# Patient Record
Sex: Female | Born: 2005
Health system: Southern US, Community
[De-identification: ages and names within clinical notes are randomized; demographics above are authoritative.]

## PROBLEM LIST (undated history)

## (undated) DIAGNOSIS — L309 Dermatitis, unspecified: Secondary | ICD-10-CM

## (undated) DIAGNOSIS — R0683 Snoring: Secondary | ICD-10-CM

## (undated) DIAGNOSIS — J309 Allergic rhinitis, unspecified: Secondary | ICD-10-CM

## (undated) DIAGNOSIS — E669 Obesity, unspecified: Secondary | ICD-10-CM

## (undated) DIAGNOSIS — J353 Hypertrophy of tonsils with hypertrophy of adenoids: Secondary | ICD-10-CM

## (undated) DIAGNOSIS — E119 Type 2 diabetes mellitus without complications: Secondary | ICD-10-CM

## (undated) DIAGNOSIS — H539 Unspecified visual disturbance: Secondary | ICD-10-CM

## (undated) HISTORY — PX: ROOT CANAL: SHX2363

## (undated) HISTORY — PX: LEG SURGERY: SHX1003

## (undated) HISTORY — DX: Allergic rhinitis, unspecified: J30.9

## (undated) HISTORY — DX: Type 2 diabetes mellitus without complications: E11.9

## (undated) HISTORY — DX: Obesity, unspecified: E66.9

## (undated) HISTORY — DX: Dermatitis, unspecified: L30.9

---
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
## 2023-01-09 ENCOUNTER — Ambulatory Visit (INDEPENDENT_AMBULATORY_CARE_PROVIDER_SITE_OTHER): Payer: Commercial Managed Care - PPO | Admitting: Pediatric Endocrinology

## 2023-01-14 DIAGNOSIS — F3289 Other specified depressive episodes: Secondary | ICD-10-CM | POA: Diagnosis not present

## 2023-01-20 ENCOUNTER — Other Ambulatory Visit (HOSPITAL_COMMUNITY): Payer: Self-pay

## 2023-01-20 ENCOUNTER — Other Ambulatory Visit: Payer: Self-pay

## 2023-01-28 DIAGNOSIS — F3289 Other specified depressive episodes: Secondary | ICD-10-CM | POA: Diagnosis not present

## 2023-01-31 ENCOUNTER — Encounter (INDEPENDENT_AMBULATORY_CARE_PROVIDER_SITE_OTHER): Payer: Self-pay

## 2023-02-04 ENCOUNTER — Ambulatory Visit (INDEPENDENT_AMBULATORY_CARE_PROVIDER_SITE_OTHER): Payer: Commercial Managed Care - PPO | Admitting: Pediatric Endocrinology

## 2023-02-10 ENCOUNTER — Other Ambulatory Visit (HOSPITAL_COMMUNITY): Payer: Self-pay

## 2023-02-10 ENCOUNTER — Other Ambulatory Visit (INDEPENDENT_AMBULATORY_CARE_PROVIDER_SITE_OTHER): Payer: Self-pay | Admitting: Pediatric Endocrinology

## 2023-02-10 DIAGNOSIS — E109 Type 1 diabetes mellitus without complications: Secondary | ICD-10-CM

## 2023-02-10 MED ORDER — DEXCOM G6 TRANSMITTER MISC
3 refills | Status: AC
Start: 2023-02-10 — End: 2024-02-10
  Filled 2023-02-10: qty 1, 90d supply, fill #0
  Filled 2023-03-02: qty 1, 90d supply, fill #1
  Filled 2023-03-04: qty 1, 30d supply, fill #1
  Filled 2023-03-04: qty 1, 90d supply, fill #1
  Filled 2023-06-02: qty 1, 90d supply, fill #2

## 2023-02-11 DIAGNOSIS — F3289 Other specified depressive episodes: Secondary | ICD-10-CM | POA: Diagnosis not present

## 2023-02-12 ENCOUNTER — Other Ambulatory Visit (HOSPITAL_COMMUNITY): Payer: Self-pay

## 2023-02-13 ENCOUNTER — Encounter (INDEPENDENT_AMBULATORY_CARE_PROVIDER_SITE_OTHER): Payer: Self-pay

## 2023-02-17 ENCOUNTER — Other Ambulatory Visit (HOSPITAL_COMMUNITY): Payer: Self-pay

## 2023-02-17 ENCOUNTER — Other Ambulatory Visit (INDEPENDENT_AMBULATORY_CARE_PROVIDER_SITE_OTHER): Payer: Self-pay | Admitting: Pediatric Endocrinology

## 2023-02-18 ENCOUNTER — Other Ambulatory Visit: Payer: Self-pay

## 2023-02-18 ENCOUNTER — Other Ambulatory Visit (HOSPITAL_COMMUNITY): Payer: Self-pay

## 2023-02-18 MED FILL — "Insulin Pen Needle 32 G X 4 MM (1/6"" or 5/32"")": 90 days supply | Qty: 100 | Fill #0 | Status: AC

## 2023-02-19 ENCOUNTER — Other Ambulatory Visit: Payer: Self-pay

## 2023-02-26 ENCOUNTER — Other Ambulatory Visit (HOSPITAL_COMMUNITY): Payer: Self-pay

## 2023-03-03 ENCOUNTER — Other Ambulatory Visit (HOSPITAL_COMMUNITY): Payer: Self-pay

## 2023-03-04 ENCOUNTER — Other Ambulatory Visit (HOSPITAL_COMMUNITY): Payer: Self-pay

## 2023-03-08 ENCOUNTER — Other Ambulatory Visit (HOSPITAL_COMMUNITY): Payer: Self-pay

## 2023-03-16 ENCOUNTER — Other Ambulatory Visit (HOSPITAL_COMMUNITY): Payer: Self-pay

## 2023-03-17 ENCOUNTER — Other Ambulatory Visit: Payer: Self-pay

## 2023-03-17 ENCOUNTER — Other Ambulatory Visit (HOSPITAL_COMMUNITY): Payer: Self-pay

## 2023-03-18 ENCOUNTER — Ambulatory Visit (INDEPENDENT_AMBULATORY_CARE_PROVIDER_SITE_OTHER): Payer: Commercial Managed Care - PPO | Admitting: Pediatric Endocrinology

## 2023-03-18 ENCOUNTER — Encounter (INDEPENDENT_AMBULATORY_CARE_PROVIDER_SITE_OTHER): Payer: Self-pay | Admitting: Pediatric Endocrinology

## 2023-03-18 VITALS — BP 116/60 | HR 85 | Ht 60.0 in | Wt 250.0 lb

## 2023-03-18 DIAGNOSIS — E119 Type 2 diabetes mellitus without complications: Secondary | ICD-10-CM

## 2023-03-18 DIAGNOSIS — Z7985 Long-term (current) use of injectable non-insulin antidiabetic drugs: Secondary | ICD-10-CM | POA: Diagnosis not present

## 2023-03-18 DIAGNOSIS — Z794 Long term (current) use of insulin: Secondary | ICD-10-CM | POA: Diagnosis not present

## 2023-03-18 LAB — POCT GLUCOSE (DEVICE FOR HOME USE): POC Glucose: 136 mg/dl — AB (ref 70–99)

## 2023-03-18 LAB — POCT GLYCOSYLATED HEMOGLOBIN (HGB A1C): Hemoglobin A1C: 5.6 % (ref 4.0–5.6)

## 2023-03-18 MED ORDER — OZEMPIC (2 MG/DOSE) 8 MG/3ML ~~LOC~~ SOPN
2.0000 mg | PEN_INJECTOR | SUBCUTANEOUS | 6 refills | Status: AC
Start: 2023-03-18 — End: ?
  Filled 2023-03-18: qty 6, 56d supply, fill #0
  Filled 2023-04-21: qty 9, 84d supply, fill #0
  Filled 2023-10-06 (×2): qty 9, 84d supply, fill #1

## 2023-03-18 NOTE — Progress Notes (Signed)
Subjective:  Subjective  Patient Name: Tina Greene Date of Birth: May 14, 2006  MRN: [id number]  Tina Greene  presents to clinic today for follow up evaluation and management of her new onset diabetes.   HISTORY OF PRESENT ILLNESS:   Tina Greene is a 16 y.o. AA female   Tina Greene is unaccompanied today  1. Tina Greene was seen in her PCP office on 01/14/18 for her 12 year WCC. At that visit she was noted to have interval weight loss which was felt to be intentional. She had labs drawn which revealed a hemoglobin A1C of 12.6%. Her sister has type 1 (antibody negative) diabetes diagnosed at age 55. Tina Greene was referred to endocrinology for further evaluation and management.   2. Tina Greene was last seen in pediatric endocrine clinic on 07/02/22 In the interim she has been generally healthy.    She has continued on 2 mg of Ozempic. She thinks that it is working well for her.   She is taking her injection on Sundays.   No GI symptoms associated with her Ozempic.   She is still taking 33 units of Semglee at night. She says that when she forgets to take the [facility] she can tell a difference in her sugars.   No hypoglycemia.   She is still wearing her Dexcom feels good about her glucose control. Would like to move to G7 but wants to wait until she sees new provider.   She just moved back to campus this last week.She is readjusting to walking hills etc. 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    Period:  Periods are "getting back towards regular".    3. Pertinent Review of Systems:  Constitutional: The patient feels "okay/good". The patient seems healthy and active. Eyes: Vision seems to be good. There are no recognized eye problems. Wears glasses. Went to ophthalmology in January 2024.  Neck: The patient has no complaints of anterior neck swelling, soreness, tenderness, pressure, discomfort, or difficulty swallowing.   Heart: Heart rate increases with exercise or other  physical activity. The patient has no complaints of palpitations, irregular heart beats, chest pain, or chest pressure.   Lungs: no asthma or wheezing.  Gastrointestinal: Bowel movents seem normal. The patient has no complaints of excessive hunger, acid reflux, upset stomach, stomach aches or pains, diarrhea, or constipation.  Legs: Muscle mass and strength seem normal. There are no complaints of numbness, tingling, burning, or pain. No edema is noted. Plate in leg from bowed leg.  Feet: There are no obvious foot problems. There are no complaints of numbness, tingling, burning, or pain. No edema is noted. Neurologic: There are no recognized problems with muscle movement and strength, sensation, or coord GYN/GU: periods irregular. Menarche at age 74.  LMP - getting monthly cycles since last visit - LMP 03/06/23 Annual labs: January 2023- done- no issues.   Diabetes ID- still need one   Eye exam- January 2023   Dexcom CGM download:                   PAST MEDICAL, FAMILY, AND SOCIAL HISTORY  Past Medical History:  Diagnosis Date   Adenotonsillar hypertrophy    Allergic rhinitis 03/30/2013   Diabetes mellitus without complication (HCC)    Phreesia 01/17/2020   Eczema 03/30/2013   Obesity, unspecified 03/30/2013   Snores    Vision abnormalities    wears glasses    Family History  Problem Relation Age of Onset   Diabetes Maternal  Grandfather    Cancer Paternal Grandmother        breast   Diabetes Sister    Hypertension Maternal Grandmother    Hypertension Father    Diabetes Mother    Diabetes Paternal Grandfather    Parkinsonism Paternal Grandfather      Current Outpatient Medications:    cetirizine (ZYRTEC) 10 MG tablet, Take 10 mg by mouth daily., Disp: , Rfl:    Continuous Blood Gluc Receiver (DEXCOM G6 RECEIVER) DEVI, 1 Device by Does not apply route as directed., Disp: 1 each, Rfl: 2   Continuous Glucose Sensor (DEXCOM G6 SENSOR) MISC, Change sensor every 10 days,  Disp: 3 each, Rfl: 5   Continuous Glucose Transmitter (DEXCOM G6 TRANSMITTER) MISC, USE AS DIRECTED. (RE-USE UP TO 8X WITH EACH NEW SENSOR), Disp: 1 each, Rfl: 3   ibuprofen (ADVIL) 200 MG tablet, Take 200 mg by mouth every 6 (six) hours as needed., Disp: , Rfl:    insulin glargine-yfgn (SEMGLEE) 100 UNIT/ML Pen, Inject 50 Units into the skin daily., Disp: 15 mL, Rfl: 5   Insulin Pen Needle (TECHLITE PLUS PEN NEEDLES) 32G X 4 MM MISC, Use as directed with semglee, Disp: 100 each, Rfl: 1   tretinoin (RETIN-A) 0.025 % cream, Apply 1 Application topically to face at night., Disp: 45 g, Rfl: PRN   Clindamycin-Benzoyl Per, Refr, gel, Apply 1 application  topically in the morning. (Patient not taking: Reported on 07/02/2022), Disp: 45 g, Rfl: PRN   Glucagon (BAQSIMI TWO PACK) 3 MG/DOSE POWD, Place 1 spray into the nose as directed. (Patient not taking: Reported on 03/18/2023), Disp: 2 each, Rfl: 3   Insulin Glargine (BASAGLAR KWIKPEN) 100 UNIT/ML, INJECT UP TO 50 UNITS DAILY, Disp: 15 mL, Rfl: 6   insulin lispro (HUMALOG) 200 UNIT/ML KwikPen, USE UP TO 100 UNITS PER DAY AS DIRECTED BY DR. DIAL PEN THE SAME AS PREVIOUSLY- INSULIN IS CONCENTRATED- WILL GET SMALLER VOLUME FOR SAME NUMBER UNITS (Patient not taking: Reported on 07/02/2022), Disp: 15 mL, Rfl: 5   Semaglutide, 2 MG/DOSE, (OZEMPIC, 2 MG/DOSE,) 8 MG/3ML SOPN, Inject 2 mg into the skin once a week., Disp: 6 mL, Rfl: 6   spironolactone (ALDACTONE) 50 MG tablet, Take 1 tablet (50 mg total) by mouth daily. (Patient not taking: Reported on 03/18/2023), Disp: 30 tablet, Rfl: 3  Allergies as of 03/18/2023   (No Known Allergies)     reports that she has never smoked. She has never used smokeless tobacco. She reports that she does not drink alcohol. Pediatric History  Patient Parents   Bang,Tina (Mother)   Favila,Tina (Father)   Other Topics Concern   Not on file  Social History Narrative   Lives with both parents, She enjoys dance. Dual  Enrolled at [facility] [facility] [employer] [facility] [employer] and Early college [employer].   She would l to [facility] for Occupational Therapy.    No smokers      Lives with mom and dad and a hound named Jax     1. School and Family: college at [employer].  Sophomore.  2. Activities:  Designer, television/film set for weddings. [employer] member  3. Primary Care Provider: Pcp, No  ROS: There are no other significant problems involving Tina Greene's other body systems.    Objective:  Objective  Vital Signs:    BP 116/60   Pulse 85   Ht 5' (1.524 m)   Wt 250 lb (113.4 kg)   LMP 03/10/2023  BMI 48.82 kg/m   Blood pressure %iles are not available for patients who are 18 years or older.  Ht Readings from Last 3 Encounters:  03/18/23 5' (1.524 m) (5%, Z= -1.68)*  02/18/22 5' 0.32" (1.532 m) (6%, Z= -1.54)*  12/06/21 5' 0.5" (1.537 m) (7%, Z= -1.46)*   * Growth percentiles are based on CDC (Girls, 2-20 Years) data.   Wt Readings from Last 3 Encounters:  03/18/23 250 lb (113.4 kg) (>99%, Z= 2.50)*  10/09/22 251 lb 12.8 oz (114.2 kg) (>99%, Z= 2.49)*  07/02/22 249 lb 3.2 oz (113 kg) (>99%, Z= 2.46)*   * Growth percentiles are based on CDC (Girls, 2-20 Years) data.   HC Readings from Last 3 Encounters:  No data found for [facility]   Body surface area is 2.19 meters squared. 5 %ile (Z= -1.68) based on CDC (Girls, 2-20 Years) Stature-for-age data based on Stature recorded on 03/18/2023. >99 %ile (Z= 2.50) based on CDC (Girls, 2-20 Years) weight-for-age data using data from 03/18/2023.   PHYSICAL EXAM:    Physical Exam Vitals reviewed.  Constitutional:      Appearance: Normal appearance.     Comments: Weight is stable  HENT:     Head: Normocephalic.     Right Ear: External ear normal.     Left Ear: External ear normal.     Nose: Nose normal.     Mouth/Throat:     Mouth: Mucous membranes are moist.  Eyes:     Extraocular Movements: Extraocular movements intact.   Cardiovascular:     Rate and Rhythm: Normal rate and regular rhythm.     Pulses: Normal pulses.     Heart sounds: Normal heart sounds.  Pulmonary:     Effort: Pulmonary effort is normal.     Breath sounds: Normal breath sounds.  Abdominal:     Palpations: Abdomen is soft.  Musculoskeletal:        General: Normal range of motion.     Cervical back: Normal range of motion.  Skin:    General: Skin is warm and dry.     Capillary Refill: Capillary refill takes less than 2 seconds.     Comments: +1 acanthosis  Neurological:     General: No focal deficit present.     Mental Status: She is alert.  Psychiatric:        Mood and Affect: Mood normal.    LAB DATA:     Lab Results  Component Value Date   HGBA1C 5.6 03/18/2023   HGBA1C 5.8 10/09/2022   HGBA1C 6.0 07/02/2022   HGBA1C 6.2 02/18/2022   HGBA1C 6.3 (H) 11/12/2021   HGBA1C 6.5 07/31/2021   HGBA1C 6.8 (A) 04/26/2021   HGBA1C 6.5 (A) 01/22/2021     Results for orders placed or performed in visit on 03/18/23  POCT glycosylated hemoglobin (Hb A1C)  Result Value Ref Range   Hemoglobin A1C 5.6 4.0 - 5.6 %   HbA1c POC (<> result, manual entry)     HbA1c, POC (prediabetic range)     HbA1c, POC (controlled diabetic range)    POCT Glucose (Device for Home Use)  Result Value Ref Range   Glucose Fasting, POC     POC Glucose 136 (A) 70 - 99 mg/dl      Assessment and Plan:  Assessment  ASSESSMENT: Angel is a 17 y.o. AA female with antibody negative, insulin dependant diabetes (like her sister).   Type 2 diabetes on insulin.  - Hemoglobin A1C POC reviewed  as above  She is in target for A1C control and has continued to improve.  - POC CGB reviewed as above  - Semglee 33 units nightly - Ozempic 2 mg weekly - Dexcom still with 91% in target. Goal is >70% in target. Dexcom report reviewed in detail with patient.  - Referral placed to adult endocrine in [place].   PLAN:   1. Diagnostic:  Orders Placed This Encounter   Procedures   Ambulatory referral to Endocrinology    Referral Priority:   Routine    Referral Type:   Consultation    Referral Reason:   Specialty Services Required    Referred to Provider:   Roma Kayser, MD    Number of Visits Requested:   1   POCT glycosylated hemoglobin (Hb A1C)   POCT Glucose (Device for Home Use)   COLLECTION CAPILLARY BLOOD SPECIMEN    2. Therapeutic:  Semglee 33 units.   Ozempic as above.  3. Patient education: lengthy discussion of the above.  She is overall doing well.  4. Follow-up: Return for Adult endocrine .      Dessa Phi, MD  >30 minutes spent today reviewing the medical chart, counseling the patient/family, and documenting today's encounter.   Patient referred by No ref. provider found for  Diabetes   Copy of this note sent to Pcp, No

## 2023-03-19 ENCOUNTER — Other Ambulatory Visit (HOSPITAL_COMMUNITY): Payer: Self-pay

## 2023-03-19 DIAGNOSIS — F3289 Other specified depressive episodes: Secondary | ICD-10-CM | POA: Diagnosis not present

## 2023-03-20 ENCOUNTER — Other Ambulatory Visit (HOSPITAL_COMMUNITY): Payer: Self-pay

## 2023-03-22 ENCOUNTER — Other Ambulatory Visit (HOSPITAL_COMMUNITY): Payer: Self-pay

## 2023-03-22 MED ORDER — CLINDAMYCIN PHOS-BENZOYL PEROX 1.2-5 % EX GEL
1.0000 | Freq: Every morning | CUTANEOUS | 5 refills | Status: AC
  Filled 2023-03-22: qty 45, 30d supply, fill #0

## 2023-03-24 ENCOUNTER — Other Ambulatory Visit: Payer: Self-pay

## 2023-04-02 DIAGNOSIS — F3289 Other specified depressive episodes: Secondary | ICD-10-CM | POA: Diagnosis not present

## 2023-04-03 ENCOUNTER — Other Ambulatory Visit (HOSPITAL_COMMUNITY): Payer: Self-pay

## 2023-04-03 MED ORDER — AMOXICILLIN 250 MG PO CAPS
250.0000 mg | ORAL_CAPSULE | Freq: Three times a day (TID) | ORAL | 0 refills | Status: DC
  Filled 2023-04-03: qty 15, 5d supply, fill #0

## 2023-04-04 ENCOUNTER — Other Ambulatory Visit (HOSPITAL_COMMUNITY): Payer: Self-pay

## 2023-04-08 DIAGNOSIS — F3289 Other specified depressive episodes: Secondary | ICD-10-CM | POA: Diagnosis not present

## 2023-04-11 DIAGNOSIS — L218 Other seborrheic dermatitis: Secondary | ICD-10-CM | POA: Diagnosis not present

## 2023-04-19 ENCOUNTER — Other Ambulatory Visit (HOSPITAL_COMMUNITY): Payer: Self-pay

## 2023-04-21 ENCOUNTER — Other Ambulatory Visit: Payer: Self-pay

## 2023-04-22 ENCOUNTER — Ambulatory Visit (INDEPENDENT_AMBULATORY_CARE_PROVIDER_SITE_OTHER): Payer: Commercial Managed Care - PPO | Admitting: Women's Health

## 2023-04-22 ENCOUNTER — Other Ambulatory Visit (HOSPITAL_COMMUNITY)
Admission: RE | Admit: 2023-04-22 | Discharge: 2023-04-22 | Disposition: A | Discharge status: Home / Self Care | Payer: Commercial Managed Care - PPO | Source: Ambulatory Visit | Attending: Women's Health | Admitting: Women's Health

## 2023-04-22 ENCOUNTER — Encounter: Payer: Self-pay | Admitting: Women's Health

## 2023-04-22 VITALS — BP 109/69 | HR 70 | Ht 60.0 in | Wt 252.8 lb

## 2023-04-22 DIAGNOSIS — N921 Excessive and frequent menstruation with irregular cycle: Secondary | ICD-10-CM

## 2023-04-22 DIAGNOSIS — E282 Polycystic ovarian syndrome: Secondary | ICD-10-CM | POA: Diagnosis not present

## 2023-04-22 DIAGNOSIS — N926 Irregular menstruation, unspecified: Secondary | ICD-10-CM

## 2023-04-22 DIAGNOSIS — Z113 Encounter for screening for infections with a predominantly sexual mode of transmission: Secondary | ICD-10-CM | POA: Insufficient documentation

## 2023-04-22 NOTE — Progress Notes (Signed)
GYN VISIT Patient name: AILANIE HOWMAN MRN [id number]  Date of birth: 05-31-2006 Chief Complaint:   Menstrual Problem (Irregular menses)  History of Present Illness:   DANAEJA SCHIRM is a 17 y.o. G0P0000 African-American female being seen today for irregular periods.    Patient's last menstrual period was 04/15/2023 (exact date). Menarche 9-10yo, regular until about 16-15yo. Most of time 1 period monthly, sometimes goes 46-50d in between periods. In July period lasted x 16d, sometimes heavy and changes saturated super tampon q2hr, bad cramps and dime sized clots. Does have to shave sideburns, on meds for acne. No hair thinning. Does have dark spots on neck and axilla. Doesn't really want to be on birth control. Concerned about weight gain and side effects. Never sexually active. Denies abnormal discharge, itching/odor/irritation.    The current method of family planning is abstinence.  Last pap <21yo. Results were: N/A     04/22/2023    1:56 PM 12/06/2021    1:46 PM 01/19/2020    2:29 PM 01/18/2019   11:15 AM  Depression screen PHQ 2/9  Decreased Interest 0 1 0 0  Down, Depressed, Hopeless 0 0 0 0  PHQ - 2 Score 0 1 0 0  Altered sleeping 1 0 0 3  Tired, decreased energy 1 1 0 2  Change in appetite 0 0 0 3  Feeling bad or failure about yourself  0 0 0 0  Trouble concentrating 0 1 0 0  Moving slowly or fidgety/restless 0 0 0 0  Suicidal thoughts 0  0   PHQ-9 Score 2 3 0 8        04/22/2023    1:56 PM  GAD 7 : Generalized Anxiety Score  Nervous, Anxious, on Edge 1  Control/stop worrying 1  Worry too much - different things 1  Trouble relaxing 0  Restless 0  Easily annoyed or irritable 1  Afraid - awful might happen 0  Total GAD 7 Score 4     Review of Systems:   Pertinent items are noted in HPI Denies fever/chills, dizziness, headaches, visual disturbances, fatigue, shortness of breath, chest pain, abdominal pain, vomiting, abnormal vaginal  discharge/itching/odor/irritation, problems with periods, bowel movements, urination, or intercourse unless otherwise stated above.  Pertinent History Reviewed:  Reviewed past medical,surgical, social, obstetrical and family history.  Reviewed problem list, medications and allergies. Physical Assessment:   Vitals:   04/22/23 1350  BP: 109/69  Pulse: 70  Weight: 252 lb 12.8 oz (114.7 kg)  Height: 5' (1.524 m)  Body mass index is 49.37 kg/m.       Physical Examination:   General appearance: alert, well appearing, and in no distress  Mental status: alert, oriented to person, place, and time  Skin: warm & dry, acanthosis nigricans neck and axilla  Cardiovascular: normal heart rate noted  Respiratory: normal respiratory effort, no distress  Abdomen: soft, non-tender   Pelvic: VULVA: normal appearing vulva with no masses, tenderness or lesions, VAGINA: normal appearing vagina with normal color and discharge, no lesions, CERVIX: normal appearing cervix without discharge or lesions, UTERUS: limited by habitus, no abnormalities noted, ADNEXA: limited by habitus, no abnormalities noted  Extremities: no edema   Chaperone: Sherri Kaywood    No results found for this or any previous visit (from the past 24 hour(s)).  Assessment & Plan:  1) Menorrhagia w/ irregular cycles likely r/t PCOS> CV swab sent, discussed options, gave brochure on options, wants to take home and think about it. Discussed  provera q 2-87mth if no period if she decides against birth control options. Gave printed info on PCOS diet and role of weight loss w/ regulating periods w/ PCOS  Meds: No orders of the defined types were placed in this encounter.   No orders of the defined types were placed in this encounter.   Return for will let us know if she needs anything.  Cheral Marker CNM, [facility] 04/22/2023 2:26 PM

## 2023-04-22 NOTE — Patient Instructions (Signed)
Diet for Polycystic Ovary Syndrome  Polycystic ovary syndrome (PCOS) is a common hormonal disorder that affects a woman's reproductive system. It can cause problems with menstrual periods and make it hard to get and stay pregnant. Changing what you eat can help your hormones reach normal levels, improve your health, and help you better manage PCOS.  Following a balanced diet can help you lose weight and improve the way that your body uses the hormone insulin to control blood sugar. This may include:  Eating low-fat (lean) proteins, complex carbohydrates, fresh fruits and vegetables, low-fat dairy products, healthy fats, and fiber.  Cutting down on calories.  Exercising regularly.  What are tips for following this plan?  Follow a balanced diet for meals and snacks. Eat breakfast, lunch, dinner, and one or two snacks every day.  Include protein in each meal and snack.  Choose whole grains instead of products that are made with refined flour.  Eat a variety of foods.  Exercise regularly as told by your health care provider. Aim to do at least 30 minutes of exercise on most days of the week.  If you are overweight or obese:  Pay attention to how many calories you eat. Cutting down on calories can help you lose weight.  Work with your health care provider or a dietitian to figure out how many calories you need each day.  What foods should I eat?    Fruits  Include a variety of colors and types. All fruits are helpful for PCOS.  Vegetables  Include a variety of colors and types. All vegetables are helpful for PCOS.  Grains  Whole grains, such as whole wheat. Whole-grain breads, crackers, cereals, and pasta. Unsweetened oatmeal. Bulgur, barley, quinoa, and brown rice. Tortillas made from corn or whole-wheat flour.  Meats and other proteins  Lean proteins, such as fish, chicken, beans, eggs, and tofu.  Dairy  Low-fat dairy products, such as skim milk, cheese sticks, and yogurt.  Beverages  Low-fat or fat-free drinks, such  as water, low-fat milk, sugar-free drinks, and small amounts of 100% fruit juice.  Seasonings and condiments  Ketchup. Mustard. Barbecue sauce. Relish. Low-fat or fat-free mayonnaise.  Fats and oils  Olive oil or canola oil. Walnuts and almonds.  The items listed above may not be a complete list of recommended foods and beverages. Contact a dietitian for more options.  What foods should I avoid?  Foods that are high in calories or fat, especially saturated or trans fats. Fried foods. Sweets. Products that are made from refined white flour, including white bread, pastries, white rice, and pasta.  The items listed above may not be a complete list of foods and beverages to avoid. Contact a dietitian for more information.  Summary  PCOS is a hormonal imbalance that affects a woman's reproductive system. It can cause problems with menstrual periods and make it hard to get and stay pregnant.  You can help to manage your PCOS by exercising regularly and eating a healthy, varied diet of vegetables, fruit, whole grains, lean protein, and low-fat dairy products.  Changing what you eat can improve the way that your body uses insulin, help your hormones reach normal levels, and help you lose weight.  This information is not intended to replace advice given to you by your health care provider. Make sure you discuss any questions you have with your health care provider.  Document Revised: 12/23/2019 Document Reviewed: 12/23/2019  Elsevier Patient Education  2024 [employer].

## 2023-04-24 LAB — CERVICOVAGINAL ANCILLARY ONLY
Bacterial Vaginitis (gardnerella): NEGATIVE
Candida Glabrata: NEGATIVE
Candida Vaginitis: NEGATIVE
Chlamydia: NEGATIVE
Comment: NEGATIVE
Comment: NEGATIVE
Comment: NEGATIVE
Comment: NEGATIVE
Comment: NEGATIVE
Comment: NORMAL
Neisseria Gonorrhea: NEGATIVE
Trichomonas: NEGATIVE

## 2023-04-30 DIAGNOSIS — F3289 Other specified depressive episodes: Secondary | ICD-10-CM | POA: Diagnosis not present

## 2023-05-09 ENCOUNTER — Other Ambulatory Visit (INDEPENDENT_AMBULATORY_CARE_PROVIDER_SITE_OTHER): Payer: Self-pay | Admitting: Pediatric Endocrinology

## 2023-05-09 DIAGNOSIS — E1165 Type 2 diabetes mellitus with hyperglycemia: Secondary | ICD-10-CM

## 2023-05-13 ENCOUNTER — Other Ambulatory Visit (HOSPITAL_COMMUNITY): Payer: Self-pay

## 2023-05-13 ENCOUNTER — Telehealth (INDEPENDENT_AMBULATORY_CARE_PROVIDER_SITE_OTHER): Payer: Self-pay | Admitting: Pediatric Endocrinology

## 2023-05-13 ENCOUNTER — Telehealth (INDEPENDENT_AMBULATORY_CARE_PROVIDER_SITE_OTHER): Payer: Self-pay | Admitting: Pediatrics

## 2023-05-13 DIAGNOSIS — Z794 Long term (current) use of insulin: Secondary | ICD-10-CM

## 2023-05-13 NOTE — Telephone Encounter (Signed)
  Name of who is calling: Madden  Caller's Relationship to Patient: Self  Best contact number: [phone]  Provider they see: [facility]  Reason for call: former [facility] patient called requesting temporary refill of insulin (Semglee) until she can get in with adult Endo

## 2023-05-13 NOTE — Telephone Encounter (Signed)
See other telephone encounter for today.

## 2023-05-13 NOTE — Telephone Encounter (Signed)
Attempted to call left HIPAA approved voicemail to check mychart or return phone call. Call was disconnected during leaving voicemail.

## 2023-05-13 NOTE — Telephone Encounter (Signed)
  Name of who is calling: Alira  Caller's Relationship to Patient:self  Best contact number: [phone]  Provider they see: prev [facility] pt  Reason for call: In the process of switching to adult endo but she needs her insulin refilled until she is able to be seen, please assist her with this if possible     PRESCRIPTION REFILL ONLY  Name of prescription:  Pharmacy:

## 2023-05-13 NOTE — Telephone Encounter (Signed)
Spoke with on call provider Dr. Quincy Sheehan said it was okay to send in refills until scheduled adult appointment, if no appointment is scheduled may send in 3 months of refills and remind patient to schedule  an appointment.  Attempted to return phone call left HIPAA approved voicemail to check my chart or return phone call.

## 2023-05-13 NOTE — Telephone Encounter (Signed)
[name] is calling to see if she could get a callback with a update.

## 2023-05-14 DIAGNOSIS — F3289 Other specified depressive episodes: Secondary | ICD-10-CM | POA: Diagnosis not present

## 2023-05-15 ENCOUNTER — Other Ambulatory Visit (HOSPITAL_COMMUNITY): Payer: Self-pay

## 2023-05-15 ENCOUNTER — Other Ambulatory Visit (HOSPITAL_BASED_OUTPATIENT_CLINIC_OR_DEPARTMENT_OTHER): Payer: Self-pay

## 2023-05-15 MED ORDER — INSULIN GLARGINE-YFGN 100 UNIT/ML ~~LOC~~ SOPN
50.0000 [IU] | PEN_INJECTOR | Freq: Every day | SUBCUTANEOUS | 2 refills | Status: AC
  Filled 2023-05-15: qty 15, 30d supply, fill #0
  Filled 2024-01-06: qty 15, 30d supply, fill #1

## 2023-05-15 NOTE — Addendum Note (Signed)
Addended by: Angelene Giovanni A on: 05/15/2023 09:33 AM   Modules accepted: Orders

## 2023-05-19 ENCOUNTER — Other Ambulatory Visit (HOSPITAL_COMMUNITY): Payer: Self-pay

## 2023-05-19 ENCOUNTER — Other Ambulatory Visit (INDEPENDENT_AMBULATORY_CARE_PROVIDER_SITE_OTHER): Payer: Self-pay | Admitting: Pediatric Endocrinology

## 2023-05-19 DIAGNOSIS — E109 Type 1 diabetes mellitus without complications: Secondary | ICD-10-CM

## 2023-05-19 MED ORDER — DEXCOM G6 SENSOR MISC
5 refills | Status: AC
  Filled 2023-05-19: qty 3, 30d supply, fill #0

## 2023-05-28 DIAGNOSIS — F3289 Other specified depressive episodes: Secondary | ICD-10-CM | POA: Diagnosis not present

## 2023-06-02 ENCOUNTER — Other Ambulatory Visit (HOSPITAL_COMMUNITY): Payer: Self-pay

## 2023-06-11 DIAGNOSIS — F3289 Other specified depressive episodes: Secondary | ICD-10-CM | POA: Diagnosis not present

## 2023-06-17 ENCOUNTER — Other Ambulatory Visit (HOSPITAL_COMMUNITY): Payer: Self-pay

## 2023-06-17 DIAGNOSIS — E119 Type 2 diabetes mellitus without complications: Secondary | ICD-10-CM | POA: Diagnosis not present

## 2023-06-17 DIAGNOSIS — Z794 Long term (current) use of insulin: Secondary | ICD-10-CM | POA: Diagnosis not present

## 2023-06-17 MED ORDER — DEXCOM G7 SENSOR MISC
1 refills | Status: AC
Start: 2023-06-17 — End: ?
  Filled 2023-06-17: qty 9, 90d supply, fill #0
  Filled 2023-10-06 – 2023-10-21 (×4): qty 9, 90d supply, fill #1

## 2023-06-18 ENCOUNTER — Other Ambulatory Visit (HOSPITAL_COMMUNITY): Payer: Self-pay

## 2023-06-18 MED ORDER — ERGOCALCIFEROL 1.25 MG (50000 UT) PO CAPS
1.0000 | ORAL_CAPSULE | ORAL | 1 refills | Status: DC
  Filled 2023-06-18: qty 4, 28d supply, fill #0

## 2023-06-19 ENCOUNTER — Other Ambulatory Visit (HOSPITAL_COMMUNITY): Payer: Self-pay

## 2023-06-20 ENCOUNTER — Other Ambulatory Visit (HOSPITAL_COMMUNITY): Payer: Self-pay

## 2023-06-25 DIAGNOSIS — F3289 Other specified depressive episodes: Secondary | ICD-10-CM | POA: Diagnosis not present

## 2023-07-11 ENCOUNTER — Encounter (HOSPITAL_COMMUNITY): Payer: Self-pay | Admitting: Pharmacist

## 2023-07-11 ENCOUNTER — Other Ambulatory Visit (HOSPITAL_COMMUNITY): Payer: Self-pay

## 2023-07-11 MED ORDER — TECHLITE PEN NEEDLES 32G X 4 MM MISC
0 refills | Status: AC
  Filled 2023-07-11: qty 100, 90d supply, fill #0

## 2023-07-11 MED ORDER — INSULIN GLARGINE-YFGN 100 UNIT/ML ~~LOC~~ SOPN
50.0000 [IU] | PEN_INJECTOR | Freq: Every day | SUBCUTANEOUS | 3 refills | Status: AC
  Filled 2023-07-11: qty 15, 30d supply, fill #0

## 2023-07-11 MED ORDER — OZEMPIC (2 MG/DOSE) 8 MG/3ML ~~LOC~~ SOPN
2.0000 mg | PEN_INJECTOR | SUBCUTANEOUS | 0 refills | Status: AC
Start: 2023-07-11 — End: ?
  Filled 2023-07-11: qty 9, 84d supply, fill #0

## 2023-08-22 ENCOUNTER — Other Ambulatory Visit (HOSPITAL_COMMUNITY): Payer: Self-pay

## 2023-08-22 MED ORDER — CLINDAMYCIN PHOS-BENZOYL PEROX 1.2-5 % EX GEL
1.0000 | Freq: Every morning | CUTANEOUS | 3 refills | Status: AC
Start: 2023-08-22 — End: ?
  Filled 2023-08-22: qty 45, 30d supply, fill #0

## 2023-08-22 MED ORDER — TRIAMCINOLONE ACETONIDE 0.1 % EX CREA
1.0000 | TOPICAL_CREAM | Freq: Two times a day (BID) | CUTANEOUS | 3 refills | Status: AC | PRN
Start: 2023-08-22 — End: ?
  Filled 2023-08-22: qty 60, 30d supply, fill #0

## 2023-09-15 ENCOUNTER — Other Ambulatory Visit (HOSPITAL_COMMUNITY): Payer: Self-pay

## 2023-09-15 MED ORDER — INSULIN GLARGINE-YFGN 100 UNIT/ML ~~LOC~~ SOPN
50.0000 [IU] | PEN_INJECTOR | Freq: Every day | SUBCUTANEOUS | 3 refills | Status: AC
  Filled 2023-09-15: qty 45, 90d supply, fill #0
  Filled 2023-09-23 (×2): qty 15, 30d supply, fill #0
  Filled 2023-10-03: qty 15, 30d supply, fill #1

## 2023-09-23 ENCOUNTER — Other Ambulatory Visit (HOSPITAL_COMMUNITY): Payer: Self-pay

## 2023-10-01 ENCOUNTER — Other Ambulatory Visit: Payer: Self-pay

## 2023-10-01 ENCOUNTER — Ambulatory Visit: Payer: Commercial Managed Care - PPO | Admitting: Women's Health

## 2023-10-01 ENCOUNTER — Encounter: Payer: Self-pay | Admitting: Women's Health

## 2023-10-01 ENCOUNTER — Other Ambulatory Visit (HOSPITAL_COMMUNITY): Payer: Self-pay

## 2023-10-01 VITALS — BP 104/71 | HR 73 | Ht 60.0 in | Wt 255.0 lb

## 2023-10-01 DIAGNOSIS — N921 Excessive and frequent menstruation with irregular cycle: Secondary | ICD-10-CM | POA: Diagnosis not present

## 2023-10-01 DIAGNOSIS — E282 Polycystic ovarian syndrome: Secondary | ICD-10-CM | POA: Diagnosis not present

## 2023-10-01 DIAGNOSIS — Z3202 Encounter for pregnancy test, result negative: Secondary | ICD-10-CM

## 2023-10-01 LAB — POCT URINE PREGNANCY: Preg Test, Ur: NEGATIVE

## 2023-10-01 MED ORDER — DESOGESTREL-ETHINYL ESTRADIOL 0.15-30 MG-MCG PO TABS
1.0000 | ORAL_TABLET | Freq: Every day | ORAL | 3 refills | Status: AC
  Filled 2023-10-01: qty 84, 84d supply, fill #0
  Filled 2023-12-25: qty 84, 84d supply, fill #1
  Filled 2024-02-21 – 2024-03-17 (×2): qty 84, 84d supply, fill #2
  Filled 2024-06-09: qty 84, 84d supply, fill #3

## 2023-10-01 NOTE — Progress Notes (Signed)
 GYN VISIT Patient name: Tina Greene MRN [id number]  Date of birth: 11/05/2005 Chief Complaint:   Contraception  History of Present Illness:   Tina Greene is a 18 y.o. G0P0000 African-American female being seen today to get on birth control. Never sexually active. Period last week x 4d, very heavy, messed up 3 pairs of underwear. Menarche 9-10yo, regular until about 16-15yo. Most of time 1 period monthly, sometimes goes 46-50d in between periods. In July period lasted x 16d, sometimes heavy and changes saturated super tampon q2hr, bad cramps and dime sized clots. Does have to shave sideburns, on meds for acne. No hair thinning. Does have dark spots on neck and axilla. Doesn't really want to be on birth control. Concerned about weight gain and side effects. Never sexually active. Denies abnormal discharge, itching/odor/irritation.    Goes on cruise next week for 18th bday! Patient's last menstrual period was 09/23/2023. The current method of family planning is abstinence.  Last pap <21yo. Results were: N/A     04/22/2023    1:56 PM 12/06/2021    1:46 PM 01/19/2020    2:29 PM 01/18/2019   11:15 AM  Depression screen PHQ 2/9  Decreased Interest 0 1 0 0  Down, Depressed, Hopeless 0 0 0 0  PHQ - 2 Score 0 1 0 0  Altered sleeping 1 0 0 3  Tired, decreased energy 1 1 0 2  Change in appetite 0 0 0 3  Feeling bad or failure about yourself  0 0 0 0  Trouble concentrating 0 1 0 0  Moving slowly or fidgety/restless 0 0 0 0  Suicidal thoughts 0  0   PHQ-9 Score 2 3 0 8        04/22/2023    1:56 PM  GAD 7 : Generalized Anxiety Score  Nervous, Anxious, on Edge 1  Control/stop worrying 1  Worry too much - different things 1  Trouble relaxing 0  Restless 0  Easily annoyed or irritable 1  Afraid - awful might happen 0  Total GAD 7 Score 4     Review of Systems:   Pertinent items are noted in HPI Denies fever/chills, dizziness, headaches, visual disturbances, fatigue, shortness of  breath, chest pain, abdominal pain, vomiting, abnormal vaginal discharge/itching/odor/irritation, problems with periods, bowel movements, urination, or intercourse unless otherwise stated above.  Pertinent History Reviewed:  Reviewed past medical,surgical, social, obstetrical and family history.  Reviewed problem list, medications and allergies. Physical Assessment:   Vitals:   10/01/23 1400  BP: 104/71  Pulse: 73  Weight: 255 lb (115.7 kg)  Height: 5' (1.524 m)  Body mass index is 49.8 kg/m.       Physical Examination:   General appearance: alert, well appearing, and in no distress  Mental status: alert, oriented to person, place, and time  Skin: warm & dry   Cardiovascular: normal heart rate noted  Respiratory: normal respiratory effort, no distress  Abdomen: soft, non-tender   Pelvic: examination not indicated  Extremities: no edema   Chaperone: N/A  No results found for this or any previous visit (from the past 24 hours).  Assessment & Plan:  1) PCOS with irregular and heavy periods> rx desogestrel COC, f/u  Meds:  Meds ordered this encounter  Medications   desogestrel-ethinyl estradiol (APRI) 0.15-30 MG-MCG tablet    Sig: Take 1 tablet by mouth daily.    Dispense:  90 tablet    Refill:  3    No orders  of the defined types were placed in this encounter.   Return in about 3 months (around 01/01/2024) for med f/u, CNM, in person.  Cheral Marker CNM, [facility] 10/01/2023 2:24 PM

## 2023-10-01 NOTE — Addendum Note (Signed)
 Addended by: Moss Mc on: 10/01/2023 02:48 PM   Modules accepted: Orders

## 2023-10-03 ENCOUNTER — Other Ambulatory Visit (HOSPITAL_COMMUNITY): Payer: Self-pay

## 2023-10-06 ENCOUNTER — Other Ambulatory Visit (HOSPITAL_COMMUNITY): Payer: Self-pay

## 2023-10-06 MED ORDER — LANTUS SOLOSTAR 100 UNIT/ML ~~LOC~~ SOPN
50.0000 [IU] | PEN_INJECTOR | Freq: Every day | SUBCUTANEOUS | 1 refills | Status: AC
  Filled 2023-10-06 – 2023-10-16 (×2): qty 15, 30d supply, fill #0

## 2023-10-15 ENCOUNTER — Other Ambulatory Visit: Payer: Self-pay

## 2023-10-15 ENCOUNTER — Other Ambulatory Visit (HOSPITAL_COMMUNITY): Payer: Self-pay

## 2023-10-16 ENCOUNTER — Other Ambulatory Visit (HOSPITAL_COMMUNITY): Payer: Self-pay

## 2023-10-16 ENCOUNTER — Other Ambulatory Visit: Payer: Self-pay

## 2023-10-16 DIAGNOSIS — E559 Vitamin D deficiency, unspecified: Secondary | ICD-10-CM | POA: Diagnosis not present

## 2023-10-16 DIAGNOSIS — E1165 Type 2 diabetes mellitus with hyperglycemia: Secondary | ICD-10-CM | POA: Diagnosis not present

## 2023-10-16 DIAGNOSIS — R635 Abnormal weight gain: Secondary | ICD-10-CM | POA: Diagnosis not present

## 2023-10-16 MED ORDER — MOUNJARO 7.5 MG/0.5ML ~~LOC~~ SOAJ
7.5000 mg | SUBCUTANEOUS | 1 refills | Status: AC
Start: 2023-10-16 — End: ?
  Filled 2023-10-16: qty 2, 28d supply, fill #0

## 2023-10-16 MED ORDER — ERGOCALCIFEROL 1.25 MG (50000 UT) PO CAPS
50000.0000 [IU] | ORAL_CAPSULE | ORAL | 3 refills | Status: AC
  Filled 2023-10-16: qty 4, 28d supply, fill #0
  Filled 2023-12-25: qty 4, 28d supply, fill #1
  Filled 2024-03-17: qty 4, 28d supply, fill #2

## 2023-10-21 ENCOUNTER — Other Ambulatory Visit: Payer: Self-pay

## 2023-10-21 ENCOUNTER — Other Ambulatory Visit (HOSPITAL_COMMUNITY): Payer: Self-pay

## 2023-11-17 ENCOUNTER — Other Ambulatory Visit (HOSPITAL_COMMUNITY): Payer: Self-pay

## 2023-11-17 MED ORDER — MOUNJARO 10 MG/0.5ML ~~LOC~~ SOAJ
10.0000 mg | SUBCUTANEOUS | 0 refills | Status: AC
Start: 2023-11-17 — End: ?
  Filled 2023-11-17 – 2023-11-21 (×2): qty 2, 28d supply, fill #0

## 2023-11-18 ENCOUNTER — Other Ambulatory Visit (HOSPITAL_COMMUNITY): Payer: Self-pay

## 2023-11-18 ENCOUNTER — Other Ambulatory Visit: Payer: Self-pay

## 2023-11-18 ENCOUNTER — Encounter: Payer: Self-pay | Admitting: Pharmacist

## 2023-11-21 ENCOUNTER — Other Ambulatory Visit: Payer: Self-pay

## 2023-11-21 ENCOUNTER — Other Ambulatory Visit (HOSPITAL_COMMUNITY): Payer: Self-pay

## 2023-12-01 ENCOUNTER — Other Ambulatory Visit (HOSPITAL_COMMUNITY): Payer: Self-pay

## 2023-12-01 MED ORDER — LANTUS SOLOSTAR 100 UNIT/ML ~~LOC~~ SOPN
50.0000 [IU] | PEN_INJECTOR | Freq: Every day | SUBCUTANEOUS | 1 refills | Status: AC
Start: 2023-12-01 — End: ?
  Filled 2023-12-01: qty 15, 30d supply, fill #0
  Filled 2024-01-09: qty 15, 30d supply, fill #1

## 2023-12-01 MED ORDER — MOUNJARO 12.5 MG/0.5ML ~~LOC~~ SOAJ
12.5000 mg | SUBCUTANEOUS | 1 refills | Status: AC
  Filled 2023-12-01: qty 2, 28d supply, fill #0

## 2023-12-02 ENCOUNTER — Other Ambulatory Visit (HOSPITAL_COMMUNITY): Payer: Self-pay

## 2023-12-15 ENCOUNTER — Other Ambulatory Visit: Payer: Self-pay

## 2023-12-15 ENCOUNTER — Other Ambulatory Visit (HOSPITAL_COMMUNITY): Payer: Self-pay

## 2023-12-15 MED ORDER — MOUNJARO 12.5 MG/0.5ML ~~LOC~~ SOAJ
12.5000 mg | SUBCUTANEOUS | 1 refills | Status: AC
Start: 2023-12-15 — End: ?
  Filled 2023-12-15: qty 2, 28d supply, fill #0

## 2023-12-16 ENCOUNTER — Ambulatory Visit: Admitting: Women's Health

## 2023-12-16 ENCOUNTER — Encounter: Payer: Self-pay | Admitting: Women's Health

## 2023-12-16 VITALS — BP 102/70 | HR 88 | Ht 60.0 in | Wt 246.0 lb

## 2023-12-16 DIAGNOSIS — Z3041 Encounter for surveillance of contraceptive pills: Secondary | ICD-10-CM

## 2023-12-16 DIAGNOSIS — E282 Polycystic ovarian syndrome: Secondary | ICD-10-CM | POA: Diagnosis not present

## 2023-12-16 DIAGNOSIS — N926 Irregular menstruation, unspecified: Secondary | ICD-10-CM

## 2023-12-16 DIAGNOSIS — N921 Excessive and frequent menstruation with irregular cycle: Secondary | ICD-10-CM

## 2023-12-16 NOTE — Progress Notes (Signed)
   GYN VISIT Patient name: Tina Greene MRN [id number]  Date of birth: [birth year] Chief Complaint:   Follow-up (Doing well on pills)  History of Present Illness:   Tina Greene is a 18 y.o. G0P0000 African-American female being seen today for f/u on Apri  COCs rx'd 10/01/23 for PCOS w/ irregular and heavy periods. Periods MUCH better, regular q month on placebo pills, last 4d, lighter, not painful.     Patient's last menstrual period was 12/05/2023. The current method of family planning is OCP (estrogen/progesterone).  Last pap <21yo. Results were: N/A     04/22/2023    1:56 PM 12/06/2021    1:46 PM 01/19/2020    2:29 PM 01/18/2019   11:15 AM  Depression screen PHQ 2/9  Decreased Interest 0 1 0 0  Down, Depressed, Hopeless 0 0 0 0  PHQ - 2 Score 0 1 0 0  Altered sleeping 1 0 0 3  Tired, decreased energy 1 1 0 2  Change in appetite 0 0 0 3  Feeling bad or failure about yourself  0 0 0 0  Trouble concentrating 0 1 0 0  Moving slowly or fidgety/restless 0 0 0 0  Suicidal thoughts 0  0   PHQ-9 Score 2 3 0 8        04/22/2023    1:56 PM  GAD 7 : Generalized Anxiety Score  Nervous, Anxious, on Edge 1  Control/stop worrying 1  Worry too much - different things 1  Trouble relaxing 0  Restless 0  Easily annoyed or irritable 1  Afraid - awful might happen 0  Total GAD 7 Score 4     Review of Systems:   Pertinent items are noted in HPI Denies fever/chills, dizziness, headaches, visual disturbances, fatigue, shortness of breath, chest pain, abdominal pain, vomiting, abnormal vaginal discharge/itching/odor/irritation, problems with periods, bowel movements, urination, or intercourse unless otherwise stated above.  Pertinent History Reviewed:  Reviewed past medical,surgical, social, obstetrical and family history.  Reviewed problem list, medications and allergies. Physical Assessment:   Vitals:   12/16/23 0856  BP: 102/70  Pulse: 88  Weight: 246 lb (111.6 kg)  Height: 5'  (1.524 m)  Body mass index is 48.04 kg/m.       Physical Examination:   General appearance: alert, well appearing, and in no distress  Mental status: alert, oriented to person, place, and time  Skin: warm & dry   Cardiovascular: normal heart rate noted  Respiratory: normal respiratory effort, no distress  Abdomen: soft, non-tender   Pelvic: examination not indicated  Extremities: no edema   Chaperone: N/A  No results found for this or any previous visit (from the past 24 hours).  Assessment & Plan:  1) PCOS w/ irregular and heavy periods> doing very well on Apri  COCs, continue  Meds: No orders of the defined types were placed in this encounter.   No orders of the defined types were placed in this encounter.   Return for after 3/7 for , Pap & physical.  Ferd Householder CNM, [facility] 12/16/2023 9:23 AM

## 2023-12-25 MED FILL — "Insulin Pen Needle 32 G X 4 MM (1/6"" or 5/32"")": 90 days supply | Qty: 100 | Fill #1 | Status: AC

## 2023-12-26 ENCOUNTER — Other Ambulatory Visit (HOSPITAL_COMMUNITY): Payer: Self-pay

## 2024-01-06 ENCOUNTER — Other Ambulatory Visit (HOSPITAL_COMMUNITY): Payer: Self-pay

## 2024-01-09 ENCOUNTER — Other Ambulatory Visit: Payer: Self-pay

## 2024-01-09 ENCOUNTER — Other Ambulatory Visit (HOSPITAL_COMMUNITY): Payer: Self-pay

## 2024-01-12 ENCOUNTER — Other Ambulatory Visit (HOSPITAL_COMMUNITY): Payer: Self-pay

## 2024-01-12 MED ORDER — MOUNJARO 12.5 MG/0.5ML ~~LOC~~ SOAJ
12.5000 mg | SUBCUTANEOUS | 1 refills | Status: AC
  Filled 2024-01-12: qty 2, 28d supply, fill #0

## 2024-01-22 DIAGNOSIS — E119 Type 2 diabetes mellitus without complications: Secondary | ICD-10-CM | POA: Diagnosis not present

## 2024-01-23 ENCOUNTER — Other Ambulatory Visit (HOSPITAL_COMMUNITY): Payer: Self-pay

## 2024-01-23 MED ORDER — MOUNJARO 15 MG/0.5ML ~~LOC~~ SOAJ
15.0000 mg | SUBCUTANEOUS | 1 refills | Status: AC
  Filled 2024-01-23: qty 2, 28d supply, fill #0

## 2024-01-29 ENCOUNTER — Other Ambulatory Visit (HOSPITAL_COMMUNITY): Payer: Self-pay

## 2024-01-29 ENCOUNTER — Other Ambulatory Visit: Payer: Self-pay

## 2024-01-29 MED ORDER — MOUNJARO 15 MG/0.5ML ~~LOC~~ SOAJ
15.0000 mg | SUBCUTANEOUS | 1 refills | Status: AC
  Filled 2024-01-29 – 2024-02-04 (×3): qty 2, 28d supply, fill #0

## 2024-01-29 MED ORDER — DEXCOM G7 SENSOR MISC
1 refills | Status: AC
  Filled 2024-01-29: qty 9, 90d supply, fill #0
  Filled 2024-05-25: qty 2, 20d supply, fill #1

## 2024-02-02 ENCOUNTER — Other Ambulatory Visit (HOSPITAL_COMMUNITY): Payer: Self-pay

## 2024-02-03 ENCOUNTER — Other Ambulatory Visit: Payer: Self-pay

## 2024-02-03 ENCOUNTER — Other Ambulatory Visit (HOSPITAL_COMMUNITY): Payer: Self-pay

## 2024-02-03 MED ORDER — TECHLITE PEN NEEDLES 32G X 4 MM MISC
1 refills | Status: AC
  Filled 2024-02-03: qty 100, 100d supply, fill #0

## 2024-02-03 MED ORDER — MOUNJARO 15 MG/0.5ML ~~LOC~~ SOAJ
15.0000 mg | SUBCUTANEOUS | 1 refills | Status: AC
  Filled 2024-02-03: qty 2, 28d supply, fill #0

## 2024-02-03 MED ORDER — LANTUS SOLOSTAR 100 UNIT/ML ~~LOC~~ SOPN
50.0000 [IU] | PEN_INJECTOR | Freq: Every day | SUBCUTANEOUS | 1 refills | Status: AC
  Filled 2024-02-03: qty 15, 30d supply, fill #0

## 2024-02-03 MED ORDER — DEXCOM G7 SENSOR MISC
1 refills | Status: AC
  Filled 2024-02-03: qty 9, 90d supply, fill #0

## 2024-02-04 ENCOUNTER — Other Ambulatory Visit: Payer: Self-pay

## 2024-02-23 ENCOUNTER — Other Ambulatory Visit (HOSPITAL_COMMUNITY): Payer: Self-pay

## 2024-02-24 ENCOUNTER — Other Ambulatory Visit (HOSPITAL_COMMUNITY): Payer: Self-pay

## 2024-03-02 ENCOUNTER — Other Ambulatory Visit: Payer: Self-pay

## 2024-03-02 ENCOUNTER — Other Ambulatory Visit (HOSPITAL_COMMUNITY): Payer: Self-pay

## 2024-03-02 MED ORDER — MOUNJARO 15 MG/0.5ML ~~LOC~~ SOAJ
15.0000 mg | SUBCUTANEOUS | 1 refills | Status: AC
  Filled 2024-03-02: qty 2, 28d supply, fill #0

## 2024-03-02 MED ORDER — LANTUS SOLOSTAR 100 UNIT/ML ~~LOC~~ SOPN
50.0000 [IU] | PEN_INJECTOR | Freq: Every day | SUBCUTANEOUS | 1 refills | Status: AC
  Filled 2024-03-02: qty 15, 30d supply, fill #0
  Filled 2024-04-22: qty 15, 30d supply, fill #1

## 2024-03-17 ENCOUNTER — Other Ambulatory Visit (HOSPITAL_COMMUNITY): Payer: Self-pay

## 2024-03-30 ENCOUNTER — Other Ambulatory Visit: Payer: Self-pay

## 2024-03-30 ENCOUNTER — Other Ambulatory Visit (HOSPITAL_COMMUNITY): Payer: Self-pay

## 2024-03-30 MED ORDER — MOUNJARO 15 MG/0.5ML ~~LOC~~ SOAJ
15.0000 mg | SUBCUTANEOUS | 1 refills | Status: AC
  Filled 2024-03-30: qty 2, 28d supply, fill #0

## 2024-04-16 ENCOUNTER — Encounter: Payer: Self-pay | Admitting: *Deleted

## 2024-04-19 ENCOUNTER — Other Ambulatory Visit: Payer: Self-pay

## 2024-04-19 ENCOUNTER — Other Ambulatory Visit (HOSPITAL_COMMUNITY): Payer: Self-pay

## 2024-04-19 MED ORDER — INSULIN GLARGINE-YFGN 100 UNIT/ML ~~LOC~~ SOPN
50.0000 [IU] | PEN_INJECTOR | Freq: Every day | SUBCUTANEOUS | 3 refills | Status: AC
  Filled 2024-04-19: qty 15, 30d supply, fill #0

## 2024-04-20 ENCOUNTER — Other Ambulatory Visit: Payer: Self-pay

## 2024-04-22 ENCOUNTER — Other Ambulatory Visit (HOSPITAL_COMMUNITY): Payer: Self-pay

## 2024-04-26 ENCOUNTER — Other Ambulatory Visit: Payer: Self-pay

## 2024-04-26 ENCOUNTER — Other Ambulatory Visit (HOSPITAL_COMMUNITY): Payer: Self-pay

## 2024-04-26 MED ORDER — MOUNJARO 15 MG/0.5ML ~~LOC~~ SOAJ
15.0000 mg | SUBCUTANEOUS | 1 refills | Status: AC
  Filled 2024-04-26: qty 2, 28d supply, fill #0

## 2024-05-02 ENCOUNTER — Other Ambulatory Visit (HOSPITAL_COMMUNITY): Payer: Self-pay

## 2024-05-06 ENCOUNTER — Encounter (HOSPITAL_COMMUNITY): Payer: Self-pay

## 2024-05-06 ENCOUNTER — Other Ambulatory Visit (HOSPITAL_COMMUNITY): Payer: Self-pay

## 2024-05-06 MED ORDER — DEXCOM G6 SENSOR MISC
1 refills | Status: AC
  Filled 2024-05-06: qty 3, 30d supply, fill #0

## 2024-05-11 ENCOUNTER — Other Ambulatory Visit (HOSPITAL_COMMUNITY): Payer: Self-pay

## 2024-05-12 ENCOUNTER — Other Ambulatory Visit (HOSPITAL_COMMUNITY): Payer: Self-pay

## 2024-05-18 ENCOUNTER — Other Ambulatory Visit: Payer: Self-pay

## 2024-05-18 ENCOUNTER — Other Ambulatory Visit (HOSPITAL_COMMUNITY): Payer: Self-pay

## 2024-05-20 DIAGNOSIS — E119 Type 2 diabetes mellitus without complications: Secondary | ICD-10-CM | POA: Diagnosis not present

## 2024-05-20 DIAGNOSIS — E559 Vitamin D deficiency, unspecified: Secondary | ICD-10-CM | POA: Diagnosis not present

## 2024-05-21 ENCOUNTER — Other Ambulatory Visit: Payer: Self-pay

## 2024-05-21 ENCOUNTER — Other Ambulatory Visit (HOSPITAL_COMMUNITY): Payer: Self-pay

## 2024-05-21 MED ORDER — ERGOCALCIFEROL 1.25 MG (50000 UT) PO CAPS
50000.0000 [IU] | ORAL_CAPSULE | ORAL | 3 refills | Status: AC
  Filled 2024-05-21: qty 4, 28d supply, fill #0

## 2024-05-25 ENCOUNTER — Other Ambulatory Visit: Payer: Self-pay

## 2024-05-25 ENCOUNTER — Other Ambulatory Visit (HOSPITAL_COMMUNITY): Payer: Self-pay

## 2024-05-25 MED ORDER — MOUNJARO 15 MG/0.5ML ~~LOC~~ SOAJ
15.0000 mg | SUBCUTANEOUS | 1 refills | Status: AC
  Filled 2024-05-25: qty 2, 28d supply, fill #0

## 2024-05-25 MED ORDER — INSULIN PEN NEEDLE 32G X 4 MM MISC
1 refills | Status: AC
  Filled 2024-05-25: qty 100, 90d supply, fill #0

## 2024-05-25 MED ORDER — LANTUS SOLOSTAR 100 UNIT/ML ~~LOC~~ SOPN
50.0000 [IU] | PEN_INJECTOR | Freq: Every day | SUBCUTANEOUS | 1 refills | Status: AC
  Filled 2024-05-25: qty 15, 30d supply, fill #0

## 2024-06-09 ENCOUNTER — Other Ambulatory Visit (HOSPITAL_COMMUNITY): Payer: Self-pay

## 2024-06-14 ENCOUNTER — Other Ambulatory Visit (HOSPITAL_COMMUNITY): Payer: Self-pay

## 2024-06-14 ENCOUNTER — Other Ambulatory Visit: Payer: Self-pay

## 2024-06-14 MED ORDER — DEXCOM G7 SENSOR MISC
1 refills | Status: AC
  Filled 2024-06-14: qty 9, 90d supply, fill #0

## 2024-06-16 ENCOUNTER — Other Ambulatory Visit: Payer: Self-pay

## 2024-06-16 ENCOUNTER — Other Ambulatory Visit (HOSPITAL_COMMUNITY): Payer: Self-pay

## 2024-06-28 ENCOUNTER — Other Ambulatory Visit (HOSPITAL_COMMUNITY): Payer: Self-pay

## 2024-06-28 MED ORDER — MOUNJARO 15 MG/0.5ML ~~LOC~~ SOAJ
15.0000 mg | SUBCUTANEOUS | 0 refills | Status: AC
  Filled 2024-06-28 – 2024-07-01 (×2): qty 2, 28d supply, fill #0

## 2024-07-01 ENCOUNTER — Other Ambulatory Visit (HOSPITAL_COMMUNITY): Payer: Self-pay

## 2024-07-08 ENCOUNTER — Other Ambulatory Visit (HOSPITAL_COMMUNITY): Payer: Self-pay

## 2024-07-08 ENCOUNTER — Other Ambulatory Visit: Payer: Self-pay

## 2024-07-08 MED ORDER — LANTUS SOLOSTAR 100 UNIT/ML ~~LOC~~ SOPN
50.0000 [IU] | PEN_INJECTOR | Freq: Every day | SUBCUTANEOUS | 1 refills | Status: AC
  Filled 2024-07-08: qty 15, 30d supply, fill #0

## 2024-07-15 ENCOUNTER — Other Ambulatory Visit (HOSPITAL_COMMUNITY): Payer: Self-pay

## 2024-07-26 ENCOUNTER — Other Ambulatory Visit (HOSPITAL_COMMUNITY): Payer: Self-pay

## 2024-07-26 ENCOUNTER — Other Ambulatory Visit: Payer: Self-pay

## 2024-07-26 MED ORDER — MOUNJARO 15 MG/0.5ML ~~LOC~~ SOAJ
15.0000 mg | SUBCUTANEOUS | 0 refills | Status: AC
  Filled 2024-07-26: qty 2, 28d supply, fill #0

## 2024-08-19 ENCOUNTER — Other Ambulatory Visit: Payer: Self-pay

## 2024-08-19 ENCOUNTER — Other Ambulatory Visit (HOSPITAL_BASED_OUTPATIENT_CLINIC_OR_DEPARTMENT_OTHER): Payer: Self-pay

## 2024-08-19 ENCOUNTER — Other Ambulatory Visit (HOSPITAL_COMMUNITY): Payer: Self-pay

## 2024-08-19 MED ORDER — MOUNJARO 15 MG/0.5ML ~~LOC~~ SOAJ
15.0000 mg | SUBCUTANEOUS | 0 refills | Status: AC
  Filled 2024-08-19 – 2024-08-20 (×2): qty 2, 28d supply, fill #0

## 2024-08-20 ENCOUNTER — Other Ambulatory Visit: Payer: Self-pay

## 2024-08-20 ENCOUNTER — Other Ambulatory Visit (HOSPITAL_COMMUNITY): Payer: Self-pay

## 2024-08-23 ENCOUNTER — Other Ambulatory Visit: Payer: Self-pay

## 2024-08-25 ENCOUNTER — Other Ambulatory Visit: Payer: Self-pay

## 2024-08-25 ENCOUNTER — Other Ambulatory Visit (HOSPITAL_COMMUNITY): Payer: Self-pay

## 2024-08-25 MED ORDER — LANTUS SOLOSTAR 100 UNIT/ML ~~LOC~~ SOPN
50.0000 [IU] | PEN_INJECTOR | Freq: Every day | SUBCUTANEOUS | 1 refills | Status: AC
  Filled 2024-08-25: qty 45, 90d supply, fill #0
